# Patient Record
Sex: Male | Born: 1995 | Race: White | Hispanic: No | Marital: Single | Smoking: Current every day smoker
Health system: Southern US, Community
[De-identification: ages and names within clinical notes are randomized; demographics above are authoritative.]

## PROBLEM LIST (undated history)

## (undated) HISTORY — PX: NO PAST SURGERIES: SHX2092

---

## 2018-01-21 ENCOUNTER — Encounter: Payer: Self-pay | Admitting: Gastroenterology

## 2018-02-18 ENCOUNTER — Ambulatory Visit: Payer: BC Managed Care – PPO | Admitting: Gastroenterology

## 2018-02-18 ENCOUNTER — Encounter: Payer: Self-pay | Admitting: Gastroenterology

## 2018-02-18 VITALS — BP 108/62 | HR 66 | Ht 71.0 in | Wt 110.0 lb

## 2018-02-18 DIAGNOSIS — R1310 Dysphagia, unspecified: Secondary | ICD-10-CM

## 2018-02-18 DIAGNOSIS — K219 Gastro-esophageal reflux disease without esophagitis: Secondary | ICD-10-CM | POA: Diagnosis not present

## 2018-02-18 MED ORDER — LANSOPRAZOLE 15 MG PO TBDD: 15.0000 mg | DELAYED_RELEASE_TABLET | Freq: Every day | ORAL | 3 refills | Status: AC

## 2018-02-18 NOTE — Patient Instructions (Addendum)
If you are age 21 or older, your body mass index should be between 23-30. Your Body mass index is 15.34 kg/m. If this is out of the aforementioned range listed, please consider follow up with your Primary Care Provider.  If you are age 21 or younger, your body mass index should be between 19-25. Your Body mass index is 15.34 kg/m. If this is out of the aformentioned range listed, please consider follow up with your Primary Care Provider.   You have been scheduled for an endoscopy. Please follow written instructions given to you at your visit today. If you use inhalers (even only as needed), please bring them with you on the day of your procedure. Your physician has requested that you go to www.startemmi.com and enter the access code given to you at your visit today. This web site gives a general overview about your procedure. However, you should still follow specific instructions given to you by our office regarding your preparation for the procedure.  We have sent the following medications to your pharmacy for you to pick up at your convenience: Prevacid Solutab 15mg : Take daily  Thank you for entrusting me with your care and for choosing [facility], Dr. Ileene PatrickSteven Armbruster  '

## 2018-02-18 NOTE — Progress Notes (Signed)
HPI :  22 y/o healthy male with a history of vaping / marijuana use, otherwise healthy, referred here by Lake Bellsiffany Osborne PA-C for a new patient visit for dysphagia and weight loss.   The patient is accompanied by his father today. He denies any prior medical history and is otherwise been extremely healthy for most of his life. He reports about a year ago he started to develop some mild dysphagia. Over time this has become progressive, specifically in 2-3 months time he developed severe dysphagia associated with 5 pound weight loss, has not been able to consume as much as he previously has. He has dysphagia with most any solids, he has been mostly on a soft and liquid diet over the past few months. He is also using boost supplemental drinks. He has an excellent appetite. He does endorse frequent reflux and heartburn which bothers him however he cannot consume any pills as they get stuck. He denies any odynophagia. No abdominal pains. No bowel habit changes. He denies any allergic conditions in the past such as seasonal allergies, asthma, or eczema. He denies any family history of esophageal cancer. He has never had an evaluation for this in the past. No prior endoscopies.      History reviewed. No pertinent past medical history.   Past Surgical History:  Procedure Laterality Date  . NO PAST SURGERIES     History reviewed. No pertinent family history. Social History   Tobacco Use  . Smoking status: Current Every Day Smoker  . Smokeless tobacco: Never Used  Substance Use Topics  . Alcohol use: Yes  . Drug use: Yes    Types: Marijuana   Current Outpatient Medications  Medication Sig Dispense Refill  . Multiple Vitamins-Minerals (MULTIVITAMIN GUMMIES ADULT PO) Take by mouth daily.     No current facility-administered medications for this visit.    Allergies  Allergen Reactions  . Clarithromycin      Review of Systems: All systems reviewed and negative except where noted in HPI.    Labs reviewed from PCP - done on 01/29/18 - WBC 10.1, Hgb 16.4, HCT 47.1, plt 215, no Eos, normal renal / liver function  Physical Exam: BP 108/62   Pulse 66   Ht 5\' 11"  (1.803 m)   Wt 110 lb (49.9 kg)   BMI 15.34 kg/m  Constitutional: Pleasant, thin male in no acute distress. HEENT: Normocephalic and atraumatic. Conjunctivae are normal. No scleral icterus. Neck supple.  Cardiovascular: Normal rate, regular rhythm.  Pulmonary/chest: Effort normal and breath sounds normal. No wheezing, rales or rhonchi. Abdominal: Soft, nondistended, nontender. There are no masses palpable. No hepatomegaly. Extremities: no edema Lymphadenopathy: No cervical adenopathy noted. Neurological: Alert and oriented to person place and time. Skin: Skin is warm and dry. No rashes noted. Psychiatric: Normal mood and affect. Behavior is normal.   ASSESSMENT AND PLAN: 22 year old male here for new patient visit to assess the following issues:  Dysphagia / GERD / weight loss - symptoms severe and progressive over the past few months. I discussed differential for his dysphagia with him to include eosinophilic esophagitis, peptic stricture from reflux, motility disorder, mass lesion etc. Recommend EGD to initially evaluate, and potentially dilate if stricture is amenable. Based on his symptoms it is possible his esophageal lumen is extremely narrowed. I discussed risks and benefits of endoscopy and anesthesia with him, he wanted to proceed. In the interim I will provide him a trial of Prevacid solutab 15 mg once daily to treat his reflux.  In the interim recommend he maintain on a soft diet, can use supplemental protein shakes to get his calorie intake adequate. He should avoid all meats to prevent impaction until EGD is done, should be done within the next 1-2 weeks. They agreed.  Ileene PatrickSteven Armbruster, MD [facility]  CC: Lake Bellsiffany Osborne PA-C

## 2018-02-22 ENCOUNTER — Encounter: Payer: Self-pay | Admitting: Gastroenterology

## 2018-03-01 ENCOUNTER — Encounter: Payer: Self-pay | Admitting: Gastroenterology

## 2018-03-01 ENCOUNTER — Ambulatory Visit (AMBULATORY_SURGERY_CENTER): Payer: BC Managed Care – PPO | Admitting: Gastroenterology

## 2018-03-01 VITALS — BP 91/61 | HR 71 | Temp 97.8°F | Resp 10 | Ht 71.0 in | Wt 110.0 lb

## 2018-03-01 DIAGNOSIS — K449 Diaphragmatic hernia without obstruction or gangrene: Secondary | ICD-10-CM | POA: Diagnosis not present

## 2018-03-01 DIAGNOSIS — K210 Gastro-esophageal reflux disease with esophagitis: Secondary | ICD-10-CM

## 2018-03-01 DIAGNOSIS — R1310 Dysphagia, unspecified: Secondary | ICD-10-CM

## 2018-03-01 DIAGNOSIS — K228 Other specified diseases of esophagus: Secondary | ICD-10-CM

## 2018-03-01 MED ORDER — SODIUM CHLORIDE 0.9 % IV SOLN: 500.0000 mL | Freq: Once | INTRAVENOUS | Status: AC

## 2018-03-01 NOTE — Progress Notes (Signed)
Report given to PACU, vss 

## 2018-03-01 NOTE — Progress Notes (Signed)
Pt's states no medical or surgical changes since previsit or office visit. 

## 2018-03-01 NOTE — Patient Instructions (Signed)
YOU HAD AN ENDOSCOPIC PROCEDURE TODAY AT THE [facility]:   Refer to the procedure report that was given to you for any specific questions about what was found during the examination.  If the procedure report does not answer your questions, please call your gastroenterologist to clarify.  If you requested that your care partner not be given the details of your procedure findings, then the procedure report has been included in a sealed envelope for you to review at your convenience later.  YOU SHOULD EXPECT: Some feelings of bloating in the abdomen. Passage of more gas than usual.  Walking can help get rid of the air that was put into your GI tract during the procedure and reduce the bloating. If you had a lower endoscopy (such as a colonoscopy or flexible sigmoidoscopy) you may notice spotting of blood in your stool or on the toilet paper. If you underwent a bowel prep for your procedure, you may not have a normal bowel movement for a few days.  Please Note:  You might notice some irritation and congestion in your nose or some drainage.  This is from the oxygen used during your procedure.  There is no need for concern and it should clear up in a day or so.  SYMPTOMS TO REPORT IMMEDIATELY:   Following upper endoscopy (EGD)  Vomiting of blood or coffee ground material  New chest pain or pain under the shoulder blades  Painful or persistently difficult swallowing  New shortness of breath  Fever of 100F or higher  Black, tarry-looking stools  For urgent or emergent issues, a gastroenterologist can be reached at any hour by calling [phone].   DIET:  We do recommend a small meal at first, but then you may proceed to your regular diet.  Drink plenty of fluids but you should avoid alcoholic beverages for 24 hours.  ACTIVITY:  You should plan to take it easy for the rest of today and you should NOT DRIVE or use heavy machinery until tomorrow (because of the sedation medicines used  during the test).    FOLLOW UP: Our staff will call the number listed on your records the next business day following your procedure to check on you and address any questions or concerns that you may have regarding the information given to you following your procedure. If we do not reach you, we will leave a message.  However, if you are feeling well and you are not experiencing any problems, there is no need to return our call.  We will assume that you have returned to your regular daily activities without incident.  If any biopsies were taken you will be contacted by phone or by letter within the next 1-3 weeks.  Please call us at [phone] if you have not heard about the biopsies in 3 weeks.   Await for biopsy results Continue present medications Trial of Prevacid for reflux call pharmacy make sure it's available for pick up  SIGNATURES/CONFIDENTIALITY: You and/or your care partner have signed paperwork which will be entered into your electronic medical record.  These signatures attest to the fact that that the information above on your After Visit Summary has been reviewed and is understood.  Full responsibility of the confidentiality of this discharge information lies with you and/or your care-partner.

## 2018-03-01 NOTE — Op Note (Signed)
[facility] Patient Name: Aaron Collier Procedure Date: 03/01/2018 3:35 PM MRN: [id number] Endoscopist: Viviann SpareSteven P. Adela LankArmbruster , MD Age: [age] Referring MD:  Date of Birth: 08/16/1995 Gender: Male Account #: [id number] Procedure:                Upper GI endoscopy Indications:              Dysphagia, Weight loss, history of reflux Medicines:                Monitored Anesthesia Care Procedure:                Pre-Anesthesia Assessment:                           - Prior to the procedure, a History and Physical                            was performed, and patient medications and                            allergies were reviewed. The patient's tolerance of                            previous anesthesia was also reviewed. The risks                            and benefits of the procedure and the sedation                            options and risks were discussed with the patient.                            All questions were answered, and informed consent                            was obtained. Prior Anticoagulants: The patient has                            taken no previous anticoagulant or antiplatelet                            agents. ASA Grade Assessment: I - A normal, healthy                            patient. After reviewing the risks and benefits,                            the patient was deemed in satisfactory condition to                            undergo the procedure.                           After obtaining informed consent, the endoscope was  passed under direct vision. Throughout the                            procedure, the patient's blood pressure, pulse, and                            oxygen saturations were monitored continuously. The                            Endoscope was introduced through the mouth, and                            advanced to the second part of duodenum. The upper                            GI endoscopy was  accomplished without difficulty.                            The patient tolerated the procedure well. Scope In: Scope Out: Findings:                 Esophagogastric landmarks were identified: the                            Z-line was found at 41 cm, the gastroesophageal                            junction was found at 41 cm and the upper extent of                            the gastric folds was found at 43 cm from the                            incisors.                           A 2 cm hiatal hernia was present.                           The exam of the esophagus was otherwise normal.                           A guidewire was placed and the scope was withdrawn.                            Empiric dilation was performed in the entire                            esophagus with a Savary dilator with no resistance                            at 15 mm and 17 mm. Relook endoscopy showed no  mucosal wrents. Biopsies were taken with a cold                            forceps in the upper third of the esophagus, in the                            middle third of the esophagus and in the lower                            third of the esophagus for histology to rule out of                            eosinphilic esophagitis.                           The entire examined stomach was normal.                           The duodenal bulb and second portion of the                            duodenum were normal, although the duodenal sweep                            was extremely angulated. Complications:            No immediate complications. Estimated blood loss:                            Minimal. Estimated Blood Loss:     Estimated blood loss was minimal. Impression:               - Esophagogastric landmarks identified.                           - 2 cm hiatal hernia.                           - Normal esophagus othewrwise - no stenosis /                            stricture  appreciated, empiric dilation performed                            to 17mm and biopsies taken.                           - Normal stomach.                           - Normal duodenal bulb and second portion of the                            duodenum. Recommendation:           - Patient has a contact number available for  emergencies. The signs and symptoms of potential                            delayed complications were discussed with the                            patient. Return to normal activities tomorrow.                            Written discharge instructions were provided to the                            patient.                           - Resume previous diet.                           - Continue present medications (Trial of Prevacid                            for reflux - call your pharmacy to ensure this is                            ready for pickup)                           - Await pathology results.                           - If symptoms persist despite dilation, recommend                            esophageal manometry to rule out motility disorder [name]. Armbruster, MD 03/01/2018 3:57:16 PM This report has been signed electronically.

## 2018-03-01 NOTE — Progress Notes (Signed)
Called to room to assist during endoscopic procedure.  Patient ID and intended procedure confirmed with present staff. Received instructions for my participation in the procedure from the performing physician.  

## 2018-03-02 ENCOUNTER — Telehealth: Payer: Self-pay

## 2018-03-02 ENCOUNTER — Telehealth: Payer: Self-pay | Admitting: *Deleted

## 2018-03-02 NOTE — Telephone Encounter (Signed)
Left message on follow up call. 

## 2018-03-02 NOTE — Telephone Encounter (Signed)
No answer for post procedure call back left message and will attempt to call back later this afternoon. SM 

## 2018-03-04 ENCOUNTER — Telehealth: Payer: Self-pay | Admitting: Gastroenterology

## 2018-03-04 NOTE — Telephone Encounter (Signed)
Spoke with pt and let him know that the symptoms he is having should not be related to having the EGD done. Pt instructed to contact his PCP regarding Flu like symptoms. Pt verbalized understanding.

## 2018-03-04 NOTE — Telephone Encounter (Signed)
Pt had prop EGD 12/30 with Dr. Adela LankArmbruster.  Pt reports he is having "flu-like symptoms: nausea, sweating, sore muscles, migraine and cough." Please CB.

## 2018-03-08 ENCOUNTER — Other Ambulatory Visit: Payer: Self-pay

## 2018-03-08 DIAGNOSIS — K219 Gastro-esophageal reflux disease without esophagitis: Secondary | ICD-10-CM

## 2018-03-08 DIAGNOSIS — R1310 Dysphagia, unspecified: Secondary | ICD-10-CM

## 2018-03-08 NOTE — Progress Notes (Signed)
barium

## 2018-03-12 ENCOUNTER — Ambulatory Visit (HOSPITAL_COMMUNITY): Admission: RE | Admit: 2018-03-12 | Payer: BC Managed Care – PPO | Source: Ambulatory Visit

## 2018-03-17 ENCOUNTER — Ambulatory Visit (HOSPITAL_COMMUNITY)
Admission: RE | Admit: 2018-03-17 | Discharge: 2018-03-17 | Disposition: A | Discharge status: Home / Self Care | Payer: BC Managed Care – PPO | Source: Ambulatory Visit | Attending: Gastroenterology | Admitting: Gastroenterology

## 2018-03-17 DIAGNOSIS — R1310 Dysphagia, unspecified: Secondary | ICD-10-CM | POA: Diagnosis present

## 2018-03-17 DIAGNOSIS — K219 Gastro-esophageal reflux disease without esophagitis: Secondary | ICD-10-CM | POA: Insufficient documentation

## 2018-03-19 ENCOUNTER — Other Ambulatory Visit: Payer: Self-pay

## 2018-03-19 DIAGNOSIS — R1312 Dysphagia, oropharyngeal phase: Secondary | ICD-10-CM

## 2018-03-23 ENCOUNTER — Other Ambulatory Visit (HOSPITAL_COMMUNITY): Payer: Self-pay

## 2018-03-23 DIAGNOSIS — R1310 Dysphagia, unspecified: Secondary | ICD-10-CM

## 2018-04-13 ENCOUNTER — Ambulatory Visit (HOSPITAL_COMMUNITY)
Admission: RE | Admit: 2018-04-13 | Discharge: 2018-04-13 | Disposition: A | Discharge status: Home / Self Care | Payer: BC Managed Care – PPO | Source: Ambulatory Visit | Attending: Gastroenterology | Admitting: Gastroenterology

## 2018-04-13 DIAGNOSIS — R1312 Dysphagia, oropharyngeal phase: Secondary | ICD-10-CM | POA: Insufficient documentation

## 2018-04-13 DIAGNOSIS — R1310 Dysphagia, unspecified: Secondary | ICD-10-CM | POA: Diagnosis not present

## 2020-08-18 IMAGING — RF DG ESOPHAGUS
9 of 10 series · 19 of 24 positions shown · non-contrast
Comparison: Endoscopy report of 03/01/2018

CLINICAL DATA: Difficulty prior swallowing food and pills.
Endoscopy demonstrating a 2 cm hiatal hernia.

EXAM:
ESOPHOGRAM/BARIUM SWALLOW
TECHNIQUE: Combined double contrast and single contrast examination performed
using effervescent crystals, thick barium liquid, and thin barium
liquid.
FLUOROSCOPY TIME:  Fluoroscopy Time:  3 minutes and 0 seconds
Radiation Exposure Index (if provided by the fluoroscopic device):
Applicable
Number of Acquired Spot Images: 0

[Series 1: cp_standard · 0.34mm/px · 2 of 11 frames shown (1 of 9)]
[frame 2/11]
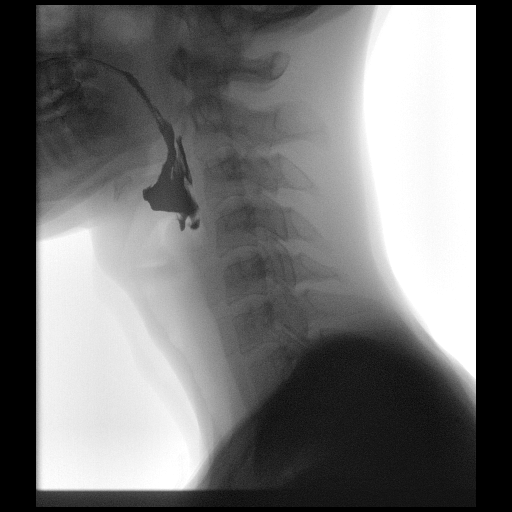
[frame 10/11]
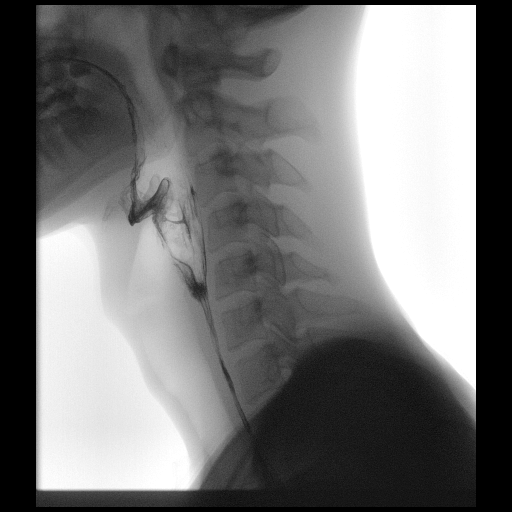

[Series 2: cp_standard · 0.34mm/px · 2 of 27 frames shown (2 of 9)]
[frame 14/27]
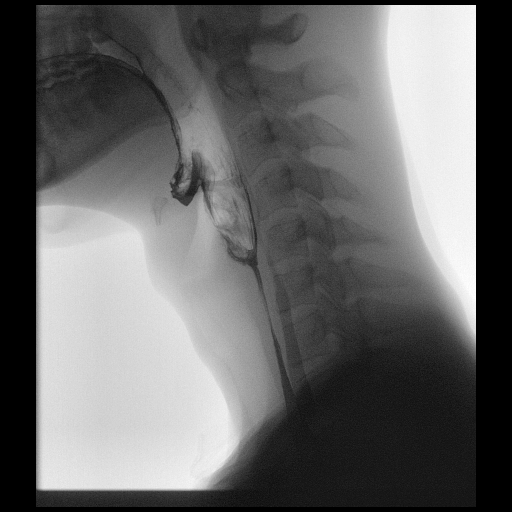
[frame 23/27]
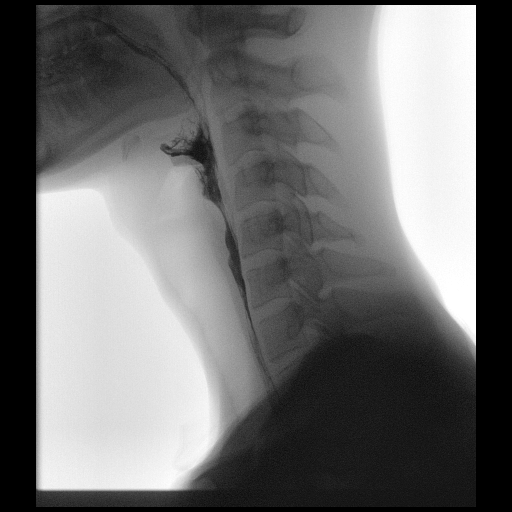

[Series 3: cp_standard · 0.51mm/px · 2 of 25 frames shown (3 of 9)]
[frame 11/25]
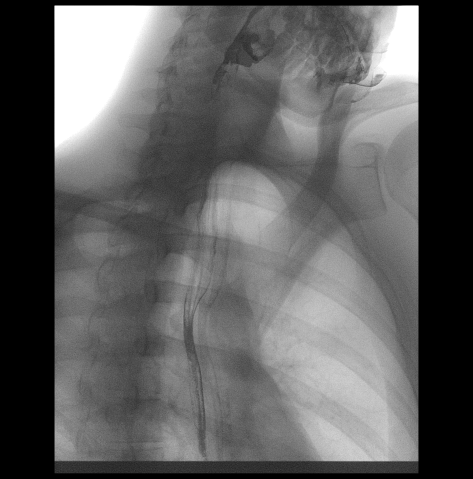
[frame 13/25]
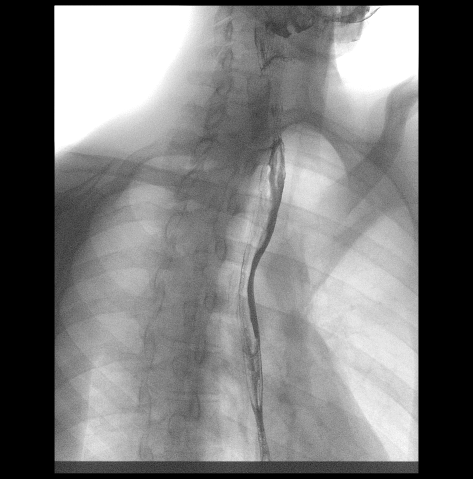

[Series 5: cp_standard · 0.51mm/px · 3 of 63 frames shown (4 of 9)]
[frame 6/63]
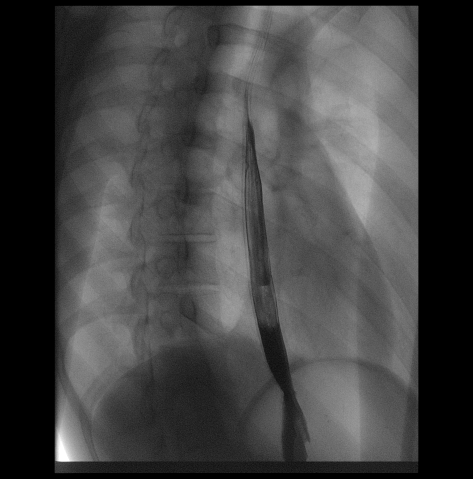
[frame 32/63]
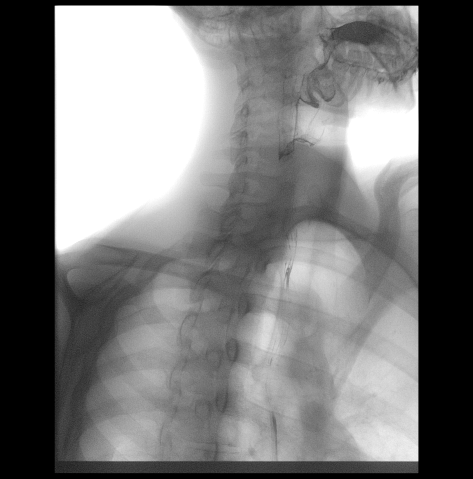
[frame 54/63]
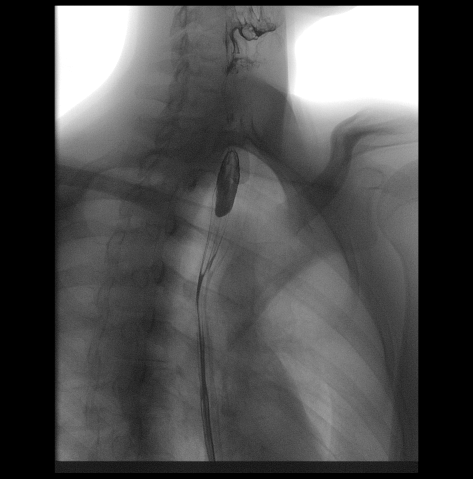

[Series 6: cp_standard · 0.34mm/px · 1 of 17 frames shown (5 of 9)]
[frame 15/17]
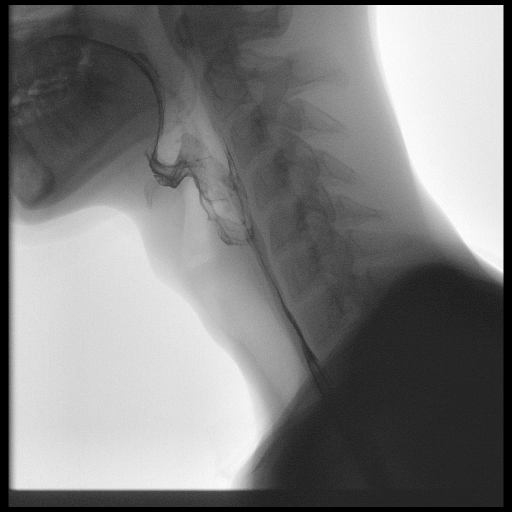

[Series 7: cp_standard · 0.51mm/px · 3 of 28 frames shown (6 of 9)]
[frame 5/28]
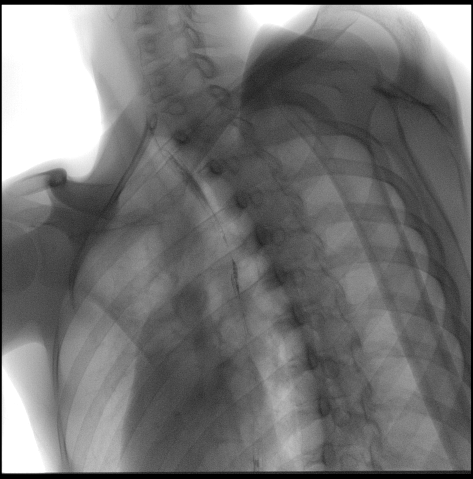
[frame 15/28]
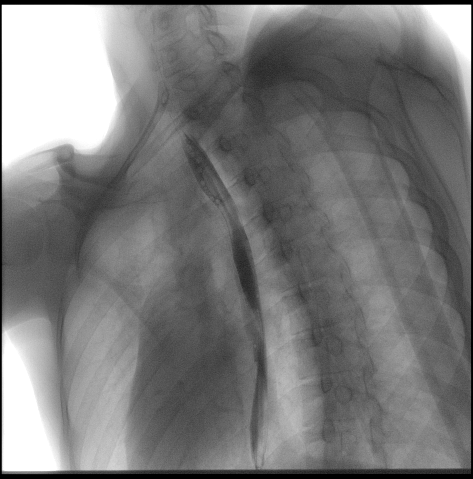
[frame 25/28]
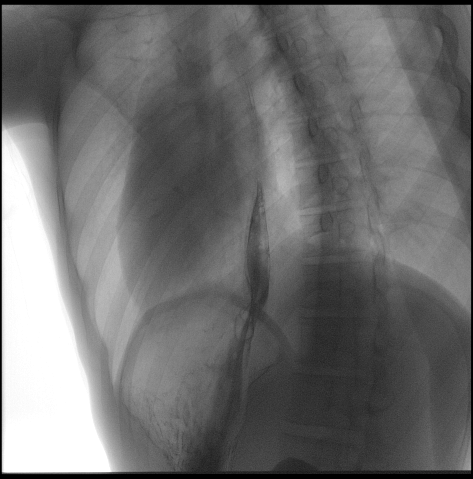

[Series 8: cp_standard · 0.52mm/px · 2 of 41 frames shown (7 of 9)]
[frame 21/41]
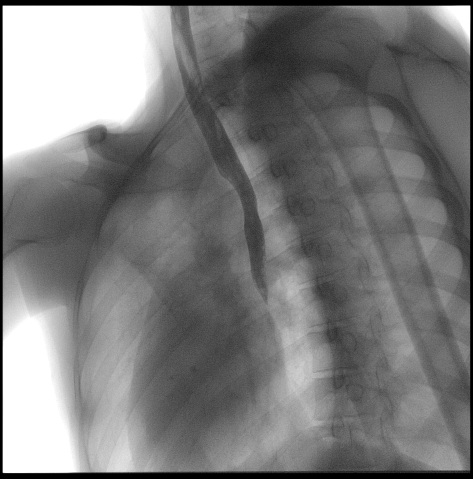
[frame 35/41]
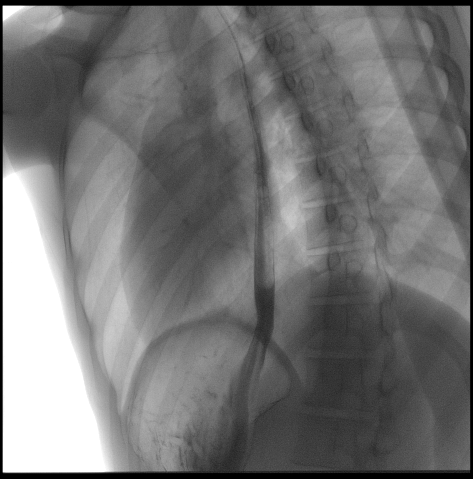

[Series 9: cp_standard · 0.52mm/px · 2 of 39 frames shown (8 of 9)]
[frame 8/39]
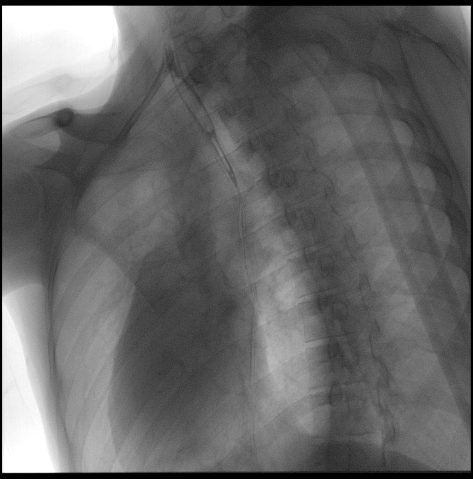
[frame 20/39]
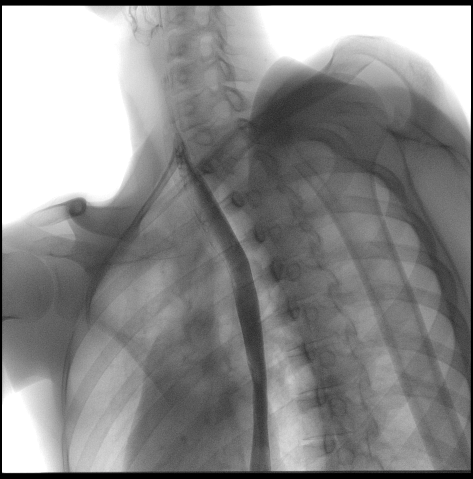

[Series 10: cp_standard · 0.51mm/px · 2 of 69 frames shown (9 of 9)]
[frame 21/69]
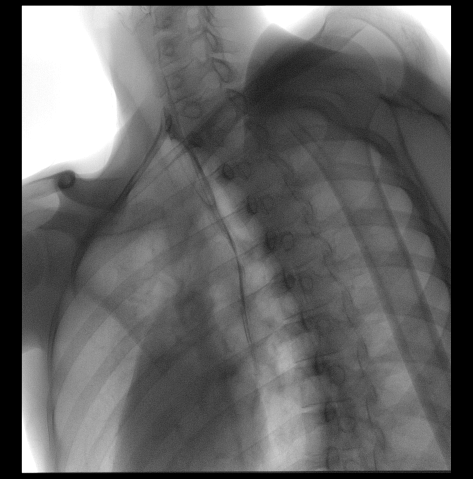
[frame 59/69]
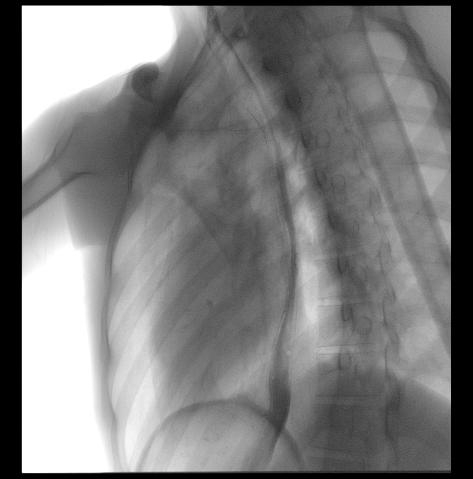

[19 of 24 positions shown; findings below may reference images not displayed]

FINDINGS: Exam mildly degraded by patient has difficulty taking large
swallows. Evaluation of the hypopharynx is grossly within normal
limits.

Double contrast evaluation of the esophagus demonstrates no mucosal
abnormality.

Evaluation of primary peristalsis demonstrates a normal primary
peristaltic wave on each swallow.

Full column evaluation of the esophagus demonstrates no persistent
narrowing or stricture. No hiatal hernia identified.

The patient refused a 13 mm barium tablet.
IMPRESSION: Normal esophagram, with mild limitations detailed above.
